# Patient Record
Sex: Female | Born: 1960 | Race: Black or African American | Hispanic: No | Marital: Single
Health system: Southern US, Community
[De-identification: ages and names within clinical notes are randomized; demographics above are authoritative.]

---

## 2013-05-16 ENCOUNTER — Ambulatory Visit: Payer: Self-pay | Admitting: Emergency Medicine

## 2013-11-25 ENCOUNTER — Emergency Department (HOSPITAL_COMMUNITY): Payer: Self-pay

## 2013-11-25 ENCOUNTER — Emergency Department (HOSPITAL_COMMUNITY)
Admission: EM | Admit: 2013-11-25 | Discharge: 2013-11-25 | Disposition: A | Discharge status: Home / Self Care | Payer: Self-pay | Attending: Emergency Medicine | Admitting: Emergency Medicine

## 2013-11-25 DIAGNOSIS — S9304XA Dislocation of right ankle joint, initial encounter: Secondary | ICD-10-CM

## 2013-11-25 DIAGNOSIS — S82851A Displaced trimalleolar fracture of right lower leg, initial encounter for closed fracture: Secondary | ICD-10-CM

## 2013-11-25 DIAGNOSIS — S82853A Displaced trimalleolar fracture of unspecified lower leg, initial encounter for closed fracture: Secondary | ICD-10-CM | POA: Insufficient documentation

## 2013-11-25 DIAGNOSIS — X500XXA Overexertion from strenuous movement or load, initial encounter: Secondary | ICD-10-CM | POA: Insufficient documentation

## 2013-11-25 DIAGNOSIS — Y9229 Other specified public building as the place of occurrence of the external cause: Secondary | ICD-10-CM | POA: Insufficient documentation

## 2013-11-25 DIAGNOSIS — Y9341 Activity, dancing: Secondary | ICD-10-CM | POA: Insufficient documentation

## 2013-11-25 DIAGNOSIS — S9306XA Dislocation of unspecified ankle joint, initial encounter: Secondary | ICD-10-CM | POA: Insufficient documentation

## 2013-11-25 DIAGNOSIS — Z79899 Other long term (current) drug therapy: Secondary | ICD-10-CM | POA: Insufficient documentation

## 2013-11-25 MED ORDER — ONDANSETRON HCL 4 MG/2ML IJ SOLN
4.0000 mg | Freq: Once | INTRAMUSCULAR | Status: AC
  Administered 2013-11-25: 4 mg via INTRAVENOUS
  Filled 2013-11-25: qty 2

## 2013-11-25 MED ORDER — FENTANYL CITRATE 0.05 MG/ML IJ SOLN
50.0000 ug | Freq: Once | INTRAMUSCULAR | Status: AC
  Administered 2013-11-25: 50 ug via INTRAVENOUS
  Filled 2013-11-25: qty 2

## 2013-11-25 MED ORDER — HYDROCODONE-ACETAMINOPHEN 5-325 MG PO TABS: 1.0000 | ORAL_TABLET | ORAL | Status: AC | PRN

## 2013-11-25 MED ORDER — ETOMIDATE 2 MG/ML IV SOLN
10.0000 mg | Freq: Once | INTRAVENOUS | Status: AC
  Administered 2013-11-25: 10 mg via INTRAVENOUS
  Filled 2013-11-25: qty 10

## 2013-11-25 NOTE — ED Notes (Signed)
Pt states that she was at the club dancing and she twisted her ankle and felt a pop. Pt states that she was unable to bear weight. Pt has pulses present and CNS intact.

## 2013-11-25 NOTE — ED Notes (Addendum)
EMS-pt was dancing when she twisted her ankle. EMS reports deformity. of fentanyl given en route. 20g(L)hand. Vitals wnl.

## 2013-11-25 NOTE — ED Notes (Signed)
Pt states that movement from stretcher to x-ray table did hurt her ankle.

## 2013-11-25 NOTE — ED Notes (Signed)
Pt reports nausea from pain and pain medication.

## 2013-11-25 NOTE — ED Notes (Signed)
Ortho tech assisting with splint of right ankle. Pt airway remains intact, pt groggy and able to answer questions.

## 2013-11-25 NOTE — Progress Notes (Signed)
Orthopedic Tech Progress Note Patient Details:  Virginia Farley 1961-03-17 [id number]  Ortho Devices Type of Ortho Device: Short leg splint;Stirrup splint;Crutches   Haskell Flirt 11/25/2013, 6:56 AM

## 2013-11-25 NOTE — ED Notes (Signed)
EDP and Ortho tech at bedside, removed patient's pants and reset pt's right ankle.

## 2013-11-25 NOTE — ED Notes (Signed)
Patient transported to X-ray 

## 2013-11-25 NOTE — Consult Note (Signed)
Reason for Consult: Right ankle fracture Referring Physician:  Lavella Lemons, MD (ER)  Virginia Farley is an 52 y.o. female.  HPI: Virginia Farley is a very pleasant 52 yo female who unfortunately rolled her ankle while dancing last night.  She had immediate deformity, onset of pain and was brought to the ER for evaluation. No other injuries reported, no other complaints from her  No past medical history on file.  No past surgical history on file.  No family history on file.  Social History:  has no tobacco, alcohol, and drug history on file.  Allergies:  Allergies  Allergen Reactions  . Shellfish Allergy Anaphylaxis    Medications: I have reviewed the patient's current medications. Scheduled:  No results found for this or any previous visit (from the past 24 hour(s)).   X-ray: EXAM:  RIGHT ANKLE - COMPLETE 3+ VIEW  COMPARISON: None.  FINDINGS:  There is a displaced oblique fracture through the distal fibula, and  displaced comminuted fracture through the medial malleolus. There is  also a minimally displaced fracture through the posterior malleolus.  Findings are compatible with a trimalleolar fracture. There is  lateral displacement of all fragments, with lateral displacement and  angulation of the talus. There is also posterior displacement of the  fibular fragment. Surrounding soft tissue swelling is noted.  No additional fractures are seen. The subtalar joint is grossly  unremarkable in appearance.   IMPRESSION:   Trimalleolar fracture, with lateral displacement of all fragments,  and mild comminution of the medial malleolar fracture. There is  associated lateral displacement and angulation of the talus, and  lateral and posterior displacement of the distal fibula.   Electronically Signed  By: Roanna Raider M.D.  On: 11/25/2013 04:31  CLINICAL DATA: Postreduction for fracture  EXAM:  RIGHT ANKLE - 2 VIEW  COMPARISON: Study obtained earlier in the day  FINDINGS:   Frontal and lateral views show the ankle in plaster. The previous  mortise dislocation has been reduced. There is a comminuted fracture  of the medial malleolus with several fracture fragments located  medial to the medial malleolus. There is an obliquely oriented  fracture of the distal fibula with mild lateral displacement  distally.   IMPRESSION:   Fractures of the distal fibula and medial malleolus. Previous  mortise dislocation has been reduced.   Electronically Signed  By: Bretta Bang M.D.  On: 11/25/2013 07:24   Review of Systems  Constitutional: Negative for fever, chills and weight loss.  HENT: Negative for congestion, hearing loss and nosebleeds.   Eyes: Negative for blurred vision and double vision.  Respiratory: Negative for cough, shortness of breath and wheezing.   Cardiovascular: Negative for chest pain.  Gastrointestinal: Negative for nausea and abdominal pain.  Musculoskeletal: Negative.   Neurological: Negative.  Negative for headaches.  Psychiatric/Behavioral: Negative.     Blood pressure 174/97, pulse 86, temperature 97 F (36.1 C), temperature source Oral, resp. rate 16, height 5\' 3"  (1.6 m), weight 87.091 kg (192 lb), SpO2 98.00%.  Physical Exam  Constitutional: She appears well-developed and well-nourished.  HENT:  Head: Normocephalic.  Neck: Normal range of motion.  Cardiovascular: Normal rate.   Respiratory: Effort normal. She has no wheezes.  GI: Soft. She exhibits no distension. There is no tenderness.  Musculoskeletal:       Right knee: Normal.       Right ankle: She exhibits decreased range of motion, swelling and deformity. She exhibits no laceration. Tenderness. Lateral malleolus and medial malleolus tenderness found.  Left ankle: Normal.  Skin: Skin is intact.  Psychiatric: She has a normal mood and affect. Her speech is normal and behavior is normal.    Assessment/Plan: Closed tri-malleolar right ankle fracture  Fracture  reduce in ER and splinted Spoke with patient about injury pattern I feel that based on medial comminution she would best treated by Dr. Victorino Dike, Foot and Ankle specialist Plus elevating and icing the ankle will help to reduce swelling and thus wound complications from surgery  Pain meds from ER Crutches and education provided NWB right lower extremity  Virginia Farley D 11/25/2013, 7:48 AM

## 2013-11-25 NOTE — ED Provider Notes (Signed)
CSN: [id number]     Arrival date & time 11/25/13  1478 History   First MD Initiated Contact with Patient 11/25/13 0349     Chief Complaint  Patient presents with  . Ankle Injury   (Consider location/radiation/quality/duration/timing/severity/associated sxs/prior Treatment) HPI  This patient is a pleasant 52 yo woman who presented with right ankle deformity and pain. She, unfortunately, rolled her right ankle while dancing and wearing high healed boots. Her pain is 10/10, worse with any attempt to bear weight or with the affected limb in the dependent position. She denies paresthesias and motor weakness. She denies head trauma and LOC. Denies pain or injury to any other region.  She describes the nature of her pain as aching and throbbing.   No past medical history on file. No past surgical history on file. No family history on file. History  Substance Use Topics  . Smoking status: Not on file  . Smokeless tobacco: Not on file  . Alcohol Use: Not on file   OB History   No data available     Review of Systems 10 point ROS obtained and is negative with the exception of sx noted above.   Allergies  Shellfish allergy  Home Medications   Current Outpatient Rx  Name  Route  Sig  Dispense  Refill  . diltiazem (DILACOR XR) 120 MG 24 hr capsule   Oral   Take 120 mg by mouth daily.         . Multiple Vitamin (MULTIVITAMIN WITH MINERALS) TABS tablet   Oral   Take by mouth daily.         Marland Kitchen omeprazole (PRILOSEC OTC) 20 MG tablet   Oral   Take 20 mg by mouth daily.         Marland Kitchen OVER THE COUNTER MEDICATION   Both Eyes   Place 1-2 drops into both eyes as needed (over the counter allergy eye drops).         . valsartan (DIOVAN) 40 MG tablet   Oral   Take 40 mg by mouth daily.          BP 174/97  Pulse 86  Temp(Src) 97 F (36.1 C) (Oral)  Resp 16  Ht 5\' 3"  (1.6 m)  Wt 192 lb (87.091 kg)  BMI 34.02 kg/m2  SpO2 98% Physical Exam  Constitutional: She is oriented to  person, place, and time. She appears well-developed and well-nourished. She appears distressed.  HENT:  Head: Normocephalic and atraumatic.  Mouth/Throat: Oropharynx is clear and moist.  No intra oral trauma identified.   Eyes: Pupils are equal, round, and reactive to light.  Neck:  No c spine ttp, FROM  Cardiovascular: Normal rate, regular rhythm, normal heart sounds and intact distal pulses.   Pulmonary/Chest: Effort normal and breath sounds normal. No respiratory distress.  Abdominal: Soft. Bowel sounds are normal.  obese  Neurological: She is alert and oriented to person, place, and time. No cranial nerve deficit.  Skin: Skin is warm and dry.  Psychiatric:  Appropriately anxious     ED Course  Procedural sedation Date/Time: 11/25/2013 6:02 AM Performed by: Brandt Loosen Authorized by: Brandt Loosen Consent: Verbal consent obtained. written consent obtained. Risks and benefits: risks, benefits and alternatives were discussed Consent given by: patient Patient understanding: patient states understanding of the procedure being performed Patient consent: the patient's understanding of the procedure matches consent given Procedure consent: procedure consent matches procedure scheduled Relevant documents: relevant documents present and verified Test results: test  results available and properly labeled Site marked: the operative site was marked Imaging studies: imaging studies available Patient identity confirmed: verbally with patient, arm band and hospital-assigned identification number Local anesthesia used: no Patient sedated: yes Sedation type: moderate (conscious) sedation Sedatives: etomidate Analgesia: fentanyl Patient tolerance: Patient tolerated the procedure well with no immediate complications. Comments: Post procedure exam - normal mental status and normal VS    PROCEDURE NOTE - CLOSE REDUCTION OF RIGHT ANKLE TRIMALLEOLAR FRACTURE (CLOSED).  Please see procedural  sedation note above. Following adequate sedation, the patient's right ankle was reduced to anatomic alignment with myself holding traction and gently manipulating and ortho tech holding counter traction. NVI intact following the procedure. Post reduction films obtained. Patient tolerated the procedure well and there were no immediate complications.   SPLINTING NOTE:  I applied and posterior short leg splint with a stirrup splint following successful reduction. Ortho tech provided assistance.    Case discussed with Dr. Charlann Boxer who will see and consult in patient in the ED. We will reduce with procedural sedation.     \Imaging Review Dg Ankle 2 Views Right  11/25/2013   CLINICAL DATA:  Postreduction for fracture  EXAM: RIGHT ANKLE - 2 VIEW  COMPARISON:  Study obtained earlier in the day  FINDINGS: Frontal and lateral views show the ankle in plaster. The previous mortise dislocation has been reduced. There is a comminuted fracture of the medial malleolus with several fracture fragments located medial to the medial malleolus. There is an obliquely oriented fracture of the distal fibula with mild lateral displacement distally.  IMPRESSION: Fractures of the distal fibula and medial malleolus. Previous mortise dislocation has been reduced.   Electronically Signed   By: Bretta Bang M.D.   On: 11/25/2013 07:24   Dg Ankle Complete Right  11/25/2013   CLINICAL DATA:  Tripped and fell; diffuse right ankle pain.  EXAM: RIGHT ANKLE - COMPLETE 3+ VIEW  COMPARISON:  None.  FINDINGS: There is a displaced oblique fracture through the distal fibula, and displaced comminuted fracture through the medial malleolus. There is also a minimally displaced fracture through the posterior malleolus. Findings are compatible with a trimalleolar fracture. There is lateral displacement of all fragments, with lateral displacement and angulation of the talus. There is also posterior displacement of the fibular fragment. Surrounding  soft tissue swelling is noted.  No additional fractures are seen. The subtalar joint is grossly unremarkable in appearance.  IMPRESSION: Trimalleolar fracture, with lateral displacement of all fragments, and mild comminution of the medial malleolar fracture. There is associated lateral displacement and angulation of the talus, and lateral and posterior displacement of the distal fibula.   Electronically Signed   By: Roanna Raider M.D.   On: 11/25/2013 04:31     MDM  Closed trimalleolar right ankle fracture.     Brandt Loosen, MD 12/07/13 [phone]

## 2015-08-08 IMAGING — CR DG ANKLE 2V *R*
2 series · 2 of 2 positions shown · non-contrast
Comparison: Study obtained earlier in the day

CLINICAL DATA: Postreduction for fracture

EXAM:
RIGHT ANKLE - 2 VIEW

[x ankle ap right]
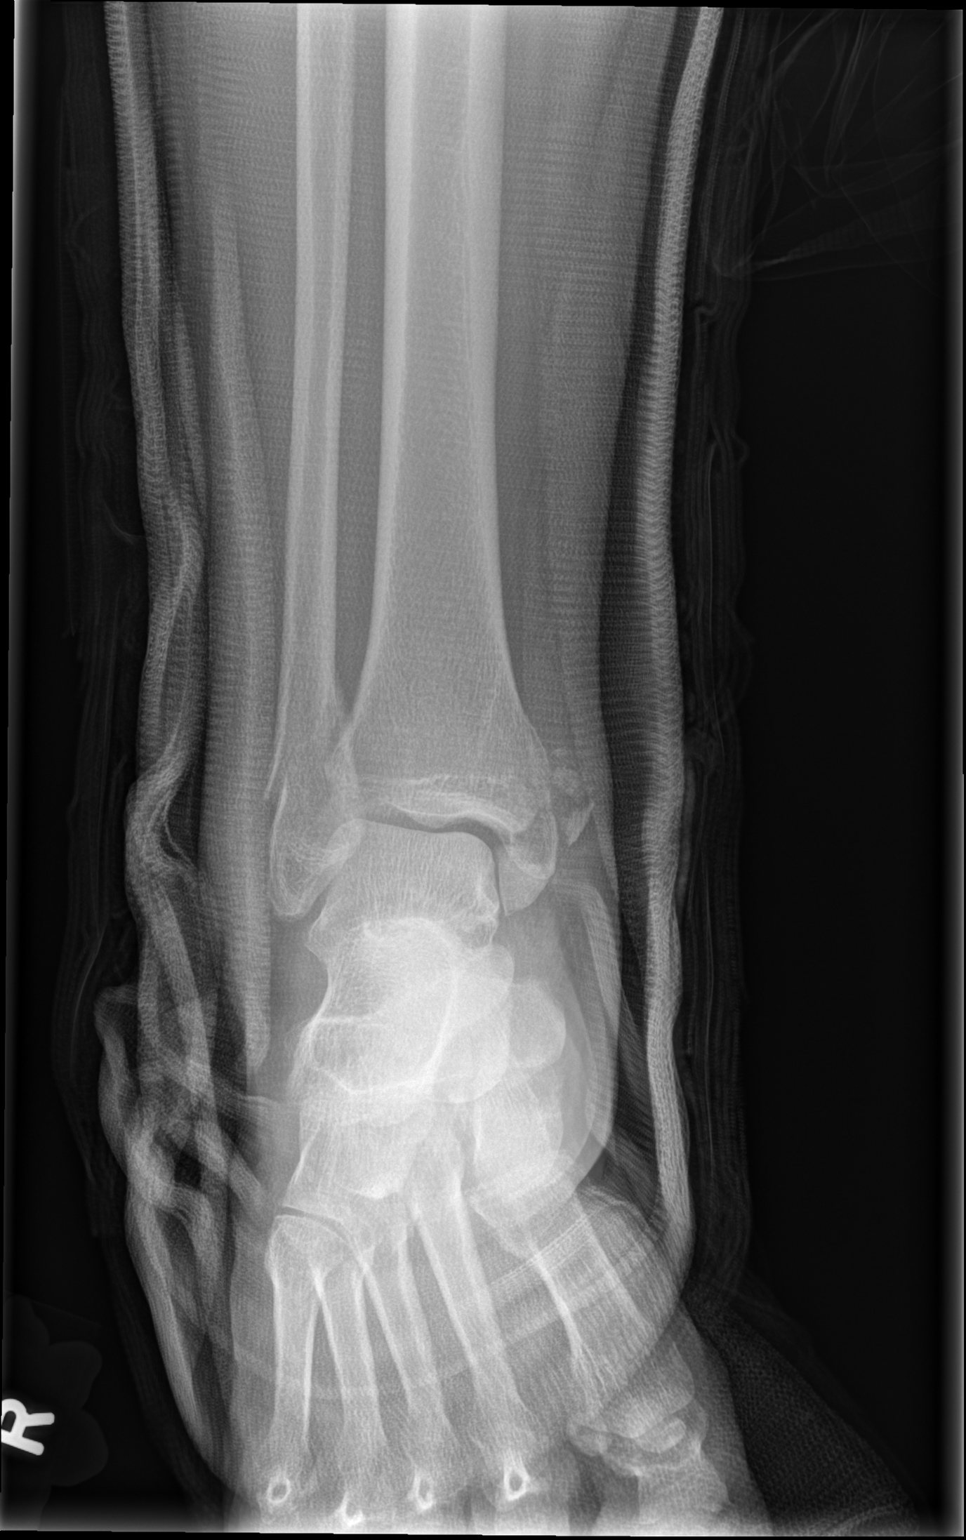

[x ankle lat right]
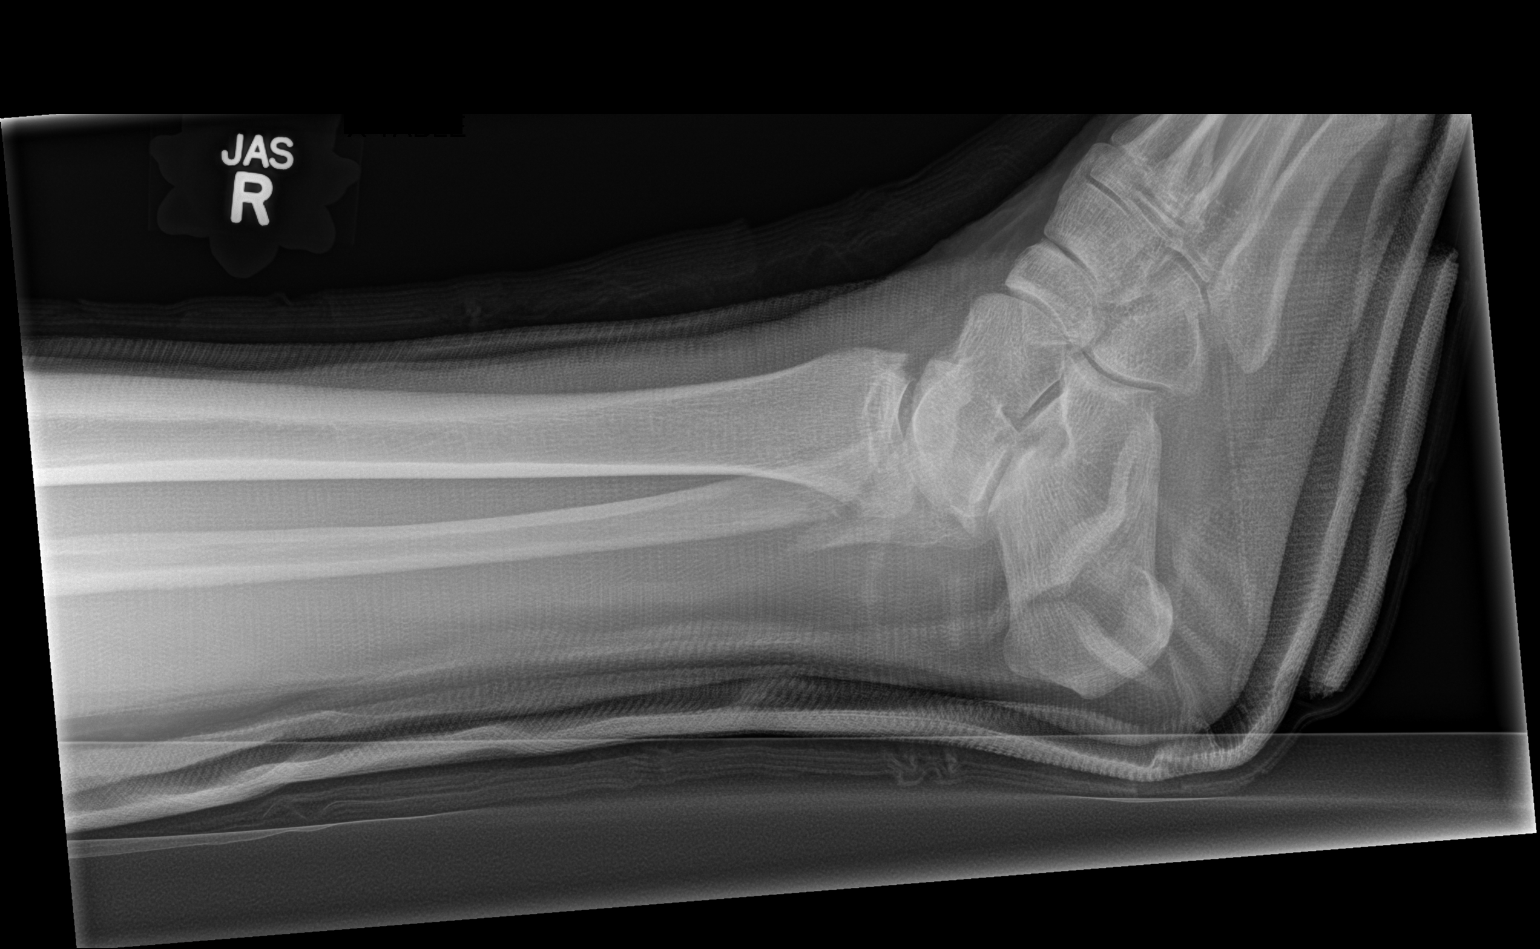

[2 of 2 positions shown; findings below may reference images not displayed]

FINDINGS: Frontal and lateral views show the ankle in plaster. The previous
mortise dislocation has been reduced. There is a comminuted fracture
of the medial malleolus with several fracture fragments located
medial to the medial malleolus. There is an obliquely oriented
fracture of the distal fibula with mild lateral displacement
distally.
IMPRESSION: Fractures of the distal fibula and medial malleolus. Previous
mortise dislocation has been reduced.

## 2015-08-08 IMAGING — CR DG ANKLE COMPLETE 3+V*R*
3 series · 3 of 3 positions shown · non-contrast
Comparison: None.

CLINICAL DATA: Tripped and fell; diffuse right ankle pain.

EXAM:
RIGHT ANKLE - COMPLETE 3+ VIEW

[x ankle ap right]
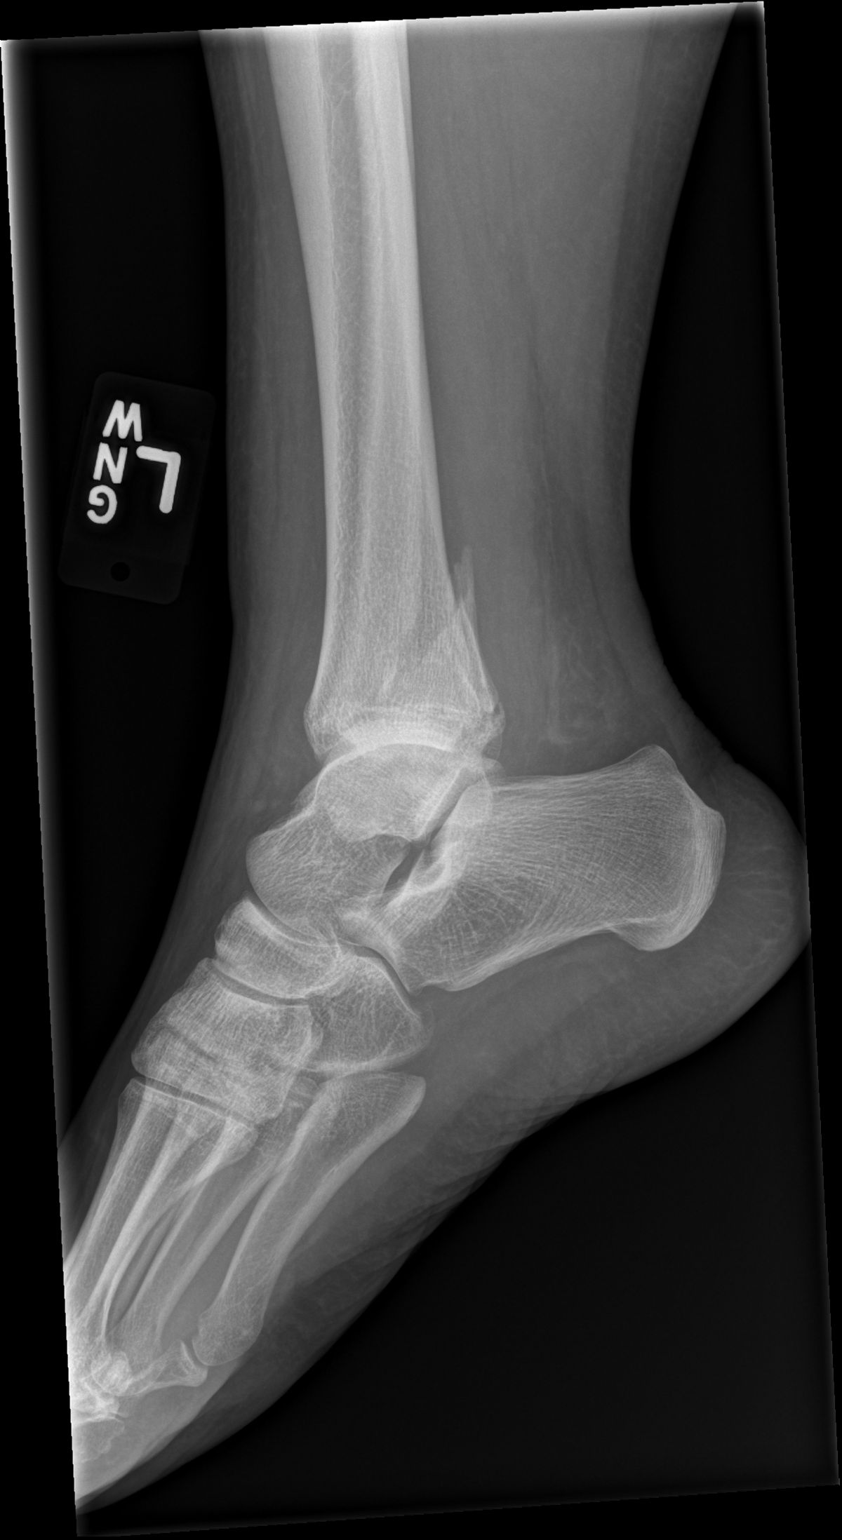

[x ankle obl right (1 of 2)]
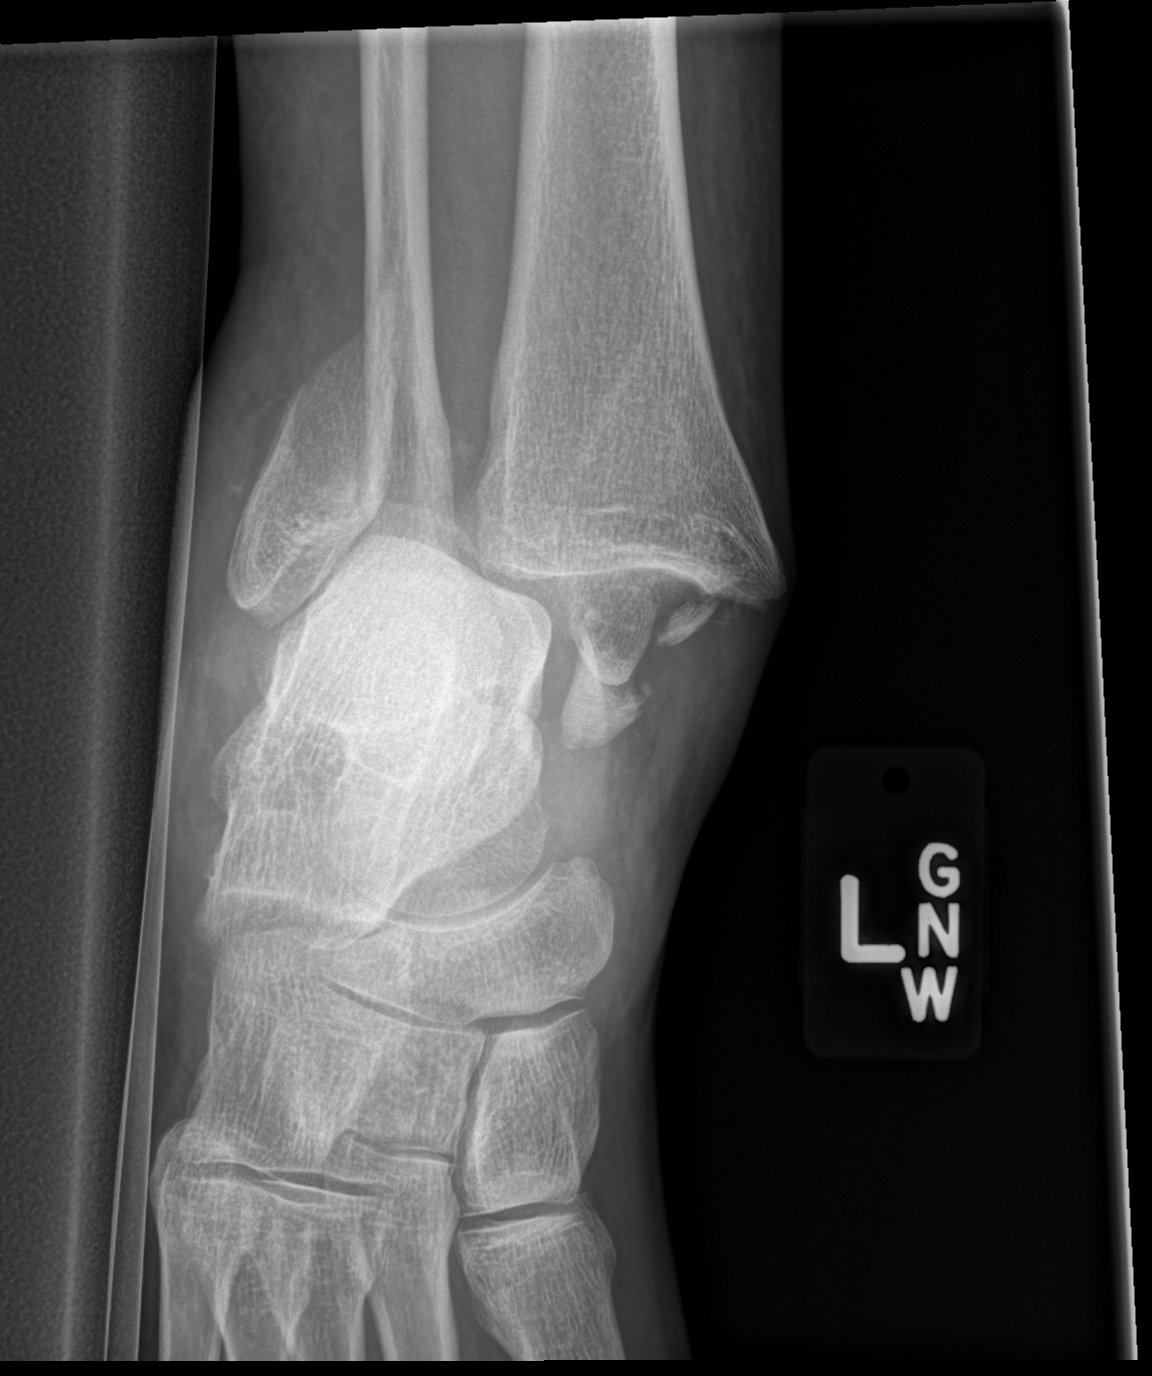

[x ankle obl right (2 of 2)]
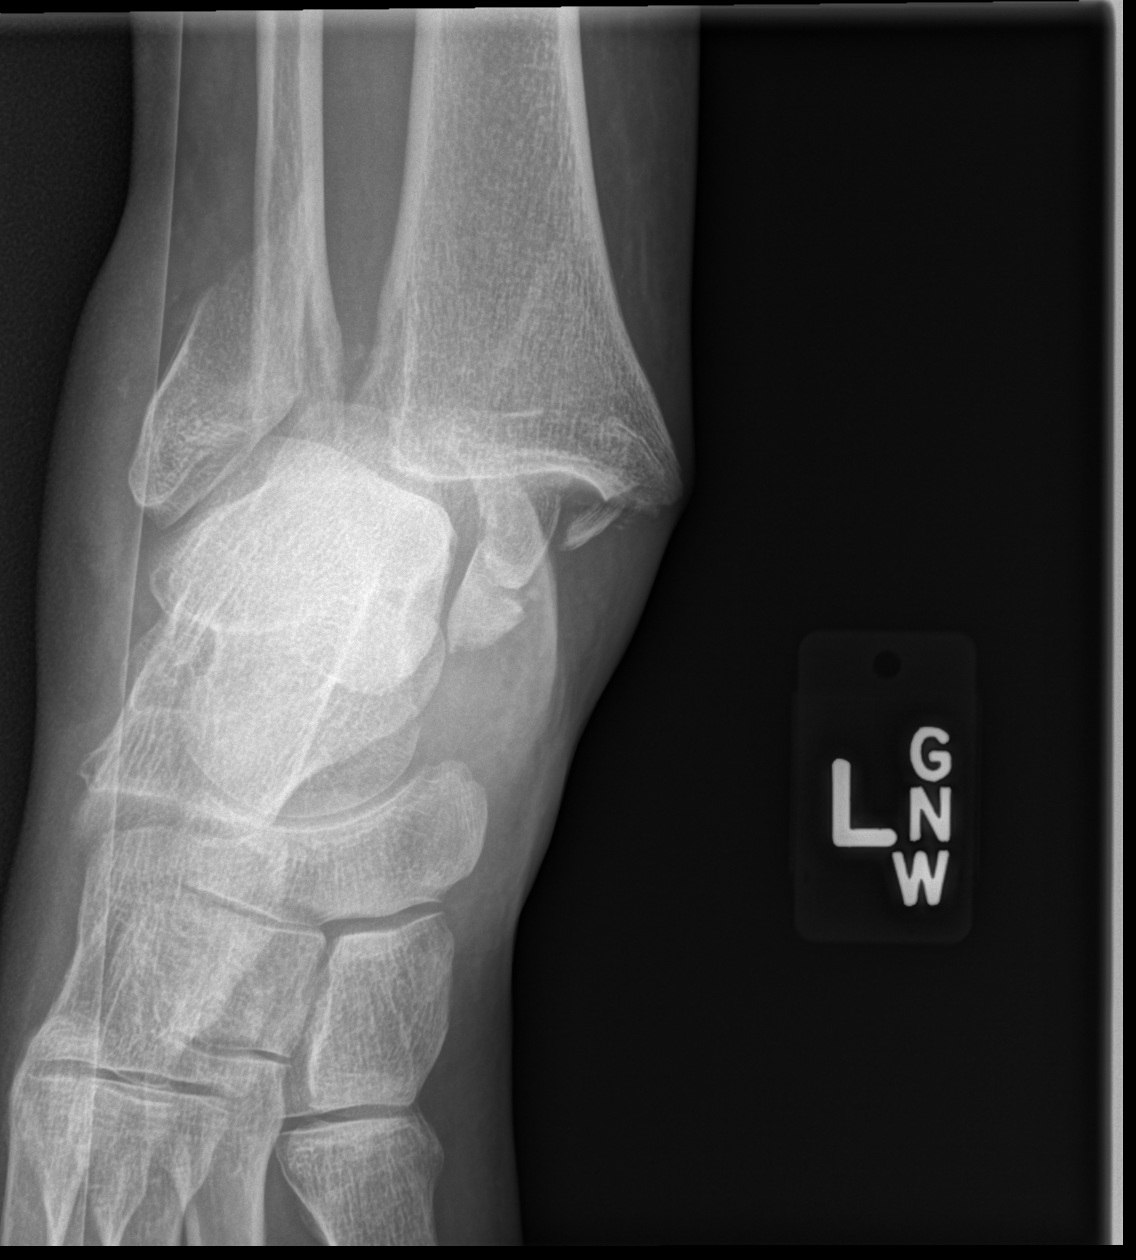

[3 of 3 positions shown; findings below may reference images not displayed]

FINDINGS: There is a displaced oblique fracture through the distal fibula, and
displaced comminuted fracture through the medial malleolus. There is
also a minimally displaced fracture through the posterior malleolus.
Findings are compatible with a trimalleolar fracture. There is
lateral displacement of all fragments, with lateral displacement and
angulation of the talus. There is also posterior displacement of the
fibular fragment. Surrounding soft tissue swelling is noted.

No additional fractures are seen. The subtalar joint is grossly
unremarkable in appearance.
IMPRESSION: Trimalleolar fracture, with lateral displacement of all fragments,
and mild comminution of the medial malleolar fracture. There is
associated lateral displacement and angulation of the talus, and
lateral and posterior displacement of the distal fibula.

## 2020-08-27 ENCOUNTER — Ambulatory Visit: Payer: Self-pay | Admitting: Family Medicine

## 2020-12-12 ENCOUNTER — Other Ambulatory Visit: Payer: Self-pay

## 2020-12-12 DIAGNOSIS — Z20822 Contact with and (suspected) exposure to covid-19: Secondary | ICD-10-CM

## 2020-12-14 LAB — SARS-COV-2, NAA 2 DAY TAT

## 2020-12-14 LAB — NOVEL CORONAVIRUS, NAA: SARS-CoV-2, NAA: DETECTED — AB

## 2020-12-16 ENCOUNTER — Telehealth: Payer: Self-pay | Admitting: Family Medicine

## 2020-12-16 NOTE — Telephone Encounter (Signed)
Patient called in already received her covid test result spoke with her PCP also

## 2020-12-24 ENCOUNTER — Other Ambulatory Visit: Payer: Self-pay

## 2020-12-24 DIAGNOSIS — Z20822 Contact with and (suspected) exposure to covid-19: Secondary | ICD-10-CM

## 2020-12-24 NOTE — Addendum Note (Signed)
Addended by: Loney Laurence on: 12/24/2020 05:37 PM   Modules accepted: Orders

## 2020-12-25 LAB — NOVEL CORONAVIRUS, NAA: SARS-CoV-2, NAA: NOT DETECTED

## 2020-12-25 LAB — SARS-COV-2, NAA 2 DAY TAT
